# Patient Record
Sex: Male | Born: 1970 | Race: Black or African American | Hispanic: No | Marital: Married | Smoking: Current some day smoker
Health system: Southern US, Community
[De-identification: ages and names within clinical notes are randomized; demographics above are authoritative.]

## PROBLEM LIST (undated history)

## (undated) DIAGNOSIS — E559 Vitamin D deficiency, unspecified: Secondary | ICD-10-CM

## (undated) HISTORY — DX: Vitamin D deficiency, unspecified: E55.9

---

## 2013-02-20 ENCOUNTER — Encounter: Payer: Self-pay | Admitting: Internal Medicine

## 2013-02-20 ENCOUNTER — Ambulatory Visit (INDEPENDENT_AMBULATORY_CARE_PROVIDER_SITE_OTHER): Payer: BC Managed Care – PPO | Admitting: Internal Medicine

## 2013-02-20 VITALS — BP 120/70 | HR 78 | Temp 98.4°F | Resp 18 | Ht 75.0 in | Wt 224.0 lb

## 2013-02-20 DIAGNOSIS — Z0000 Encounter for general adult medical examination without abnormal findings: Secondary | ICD-10-CM

## 2013-02-20 LAB — CBC WITH DIFFERENTIAL/PLATELET
Basophils Absolute: 0.1 10*3/uL (ref 0.0–0.1)
Eosinophils Absolute: 0.2 10*3/uL (ref 0.0–0.7)
Lymphocytes Relative: 26.2 % (ref 12.0–46.0)
MCHC: 34 g/dL (ref 30.0–36.0)
Monocytes Relative: 7.9 % (ref 3.0–12.0)
Neutro Abs: 5.7 10*3/uL (ref 1.4–7.7)
Platelets: 252 10*3/uL (ref 150.0–400.0)
RDW: 14.7 % — ABNORMAL HIGH (ref 11.5–14.6)

## 2013-02-20 LAB — COMPREHENSIVE METABOLIC PANEL
ALT: 21 U/L (ref 0–53)
AST: 17 U/L (ref 0–37)
Albumin: 4.1 g/dL (ref 3.5–5.2)
CO2: 28 mEq/L (ref 19–32)
Calcium: 8.7 mg/dL (ref 8.4–10.5)
Chloride: 105 mEq/L (ref 96–112)
Creatinine, Ser: 1 mg/dL (ref 0.4–1.5)
GFR: 104.42 mL/min (ref >60.00)
Potassium: 3.7 mEq/L (ref 3.5–5.1)
Sodium: 138 mEq/L (ref 135–145)
Total Protein: 6.8 g/dL (ref 6.0–8.3)

## 2013-02-20 LAB — LIPID PANEL
LDL Cholesterol: 70 mg/dL (ref 0–99)
Total CHOL/HDL Ratio: 3
Triglycerides: 111 mg/dL (ref 0.0–149.0)

## 2013-02-20 NOTE — Progress Notes (Signed)
  Subjective:    Patient ID: Brent Calhoun, male    DOB: 07/21/1971, 42 y.o.   MRN: [id number]  HPI  42 year old patient who is seen today to establish with our practice and for a preventative health examination. He enjoys excellent health without concerns or complaints. He is accompanied by his wife. Past medical history is unremarkable. No hospitalizations or surgeries he takes no chronic medications Social history he was born in [place] and has been in the [place] area since 1986. His wife works for [employer] and he presently cares for their children.  He has spent time at [employer] [employer] state and was in the engineering program. He smokes approximately 2 cigars per week Family history father age 94 without any health problems. Mother age 48 with coronary artery disease ischemic heart myopathy and ongoing tobacco use. 2 brothers in good health    Review of Systems  Constitutional: Negative for fever, chills, activity change, appetite change and fatigue.  HENT: Negative for hearing loss, ear pain, congestion, rhinorrhea, sneezing, mouth sores, trouble swallowing, neck pain, neck stiffness, dental problem, voice change, sinus pressure and tinnitus.   Eyes: Negative for photophobia, pain, redness and visual disturbance.  Respiratory: Negative for apnea, cough, choking, chest tightness, shortness of breath and wheezing.   Cardiovascular: Negative for chest pain, palpitations and leg swelling.  Gastrointestinal: Negative for nausea, vomiting, abdominal pain, diarrhea, constipation, blood in stool, abdominal distention, anal bleeding and rectal pain.  Genitourinary: Negative for dysuria, urgency, frequency, hematuria, flank pain, decreased urine volume, discharge, penile swelling, scrotal swelling, difficulty urinating, genital sores and testicular pain.  Musculoskeletal: Negative for myalgias, back pain, joint swelling, arthralgias and gait problem.  Skin: Negative for color change, rash and wound.   Neurological: Negative for dizziness, tremors, seizures, syncope, facial asymmetry, speech difficulty, weakness, light-headedness, numbness and headaches.  Hematological: Negative for adenopathy. Does not bruise/bleed easily.  Psychiatric/Behavioral: Negative for suicidal ideas, hallucinations, behavioral problems, confusion, sleep disturbance, self-injury, dysphoric mood, decreased concentration and agitation. The patient is not nervous/anxious.        Objective:   Physical Exam  Constitutional: He appears well-developed and well-nourished.  HENT:  Head: Normocephalic and atraumatic.  Right Ear: External ear normal.  Left Ear: External ear normal.  Nose: Nose normal.  Mouth/Throat: Oropharynx is clear and moist.  Eyes: Conjunctivae and EOM are normal. Pupils are equal, round, and reactive to light. No scleral icterus.  Neck: Normal range of motion. Neck supple. No JVD present. No thyromegaly present.  Cardiovascular: Regular rhythm, normal heart sounds and intact distal pulses.  Exam reveals no gallop and no friction rub.   No murmur heard. Pulmonary/Chest: Effort normal and breath sounds normal. He exhibits no tenderness.  Abdominal: Soft. Bowel sounds are normal. He exhibits no distension and no mass. There is no tenderness.  Genitourinary: Penis normal.  Musculoskeletal: Normal range of motion. He exhibits no edema and no tenderness.  Lymphadenopathy:    He has no cervical adenopathy.  Neurological: He is alert. He has normal reflexes. No cranial nerve deficit. Coordination normal.  Skin: Skin is warm and dry. No rash noted.  Psychiatric: He has a normal mood and affect. His behavior is normal.          Assessment & Plan:   Preventive health examination. Unremarkable exam. We'll check some updated lab. Modest weight loss exercise regimen encouraged

## 2013-02-20 NOTE — Patient Instructions (Signed)
It is important that you exercise regularly, at least 20 minutes 3 to 4 times per week.  If you develop chest pain or shortness of breath seek  medical attention.  You need to lose weight.  Consider a lower calorie diet and regular exercise.  Smoking tobacco is very bad for your health. You should stop smoking immediately. 

## 2013-02-26 ENCOUNTER — Telehealth: Payer: Self-pay | Admitting: Internal Medicine

## 2013-02-26 ENCOUNTER — Telehealth: Payer: Self-pay | Admitting: *Deleted

## 2013-02-26 NOTE — Telephone Encounter (Signed)
Left message on voicemail to call office. See other message in results.

## 2013-02-26 NOTE — Telephone Encounter (Signed)
Pt would like lab results from 4/4. He states that he is anxious. Please assist.

## 2013-02-26 NOTE — Telephone Encounter (Signed)
Pt called back told him results were normal and that his wife ( not daughter) called for result for him early due to [place] playing phone tag. Pt verbalized understanding and said he just received text from wife that she got results. Told him okay.

## 2013-02-26 NOTE — Telephone Encounter (Signed)
See other message

## 2014-01-18 ENCOUNTER — Ambulatory Visit (INDEPENDENT_AMBULATORY_CARE_PROVIDER_SITE_OTHER): Payer: BC Managed Care – PPO | Admitting: Family Medicine

## 2014-01-18 ENCOUNTER — Encounter: Payer: Self-pay | Admitting: Family Medicine

## 2014-01-18 VITALS — BP 112/70 | HR 93 | Temp 98.1°F | Ht 75.0 in | Wt 222.0 lb

## 2014-01-18 DIAGNOSIS — N390 Urinary tract infection, site not specified: Secondary | ICD-10-CM

## 2014-01-18 MED ORDER — CIPROFLOXACIN HCL 500 MG PO TABS: 500.0000 mg | ORAL_TABLET | Freq: Two times a day (BID) | ORAL | Status: DC

## 2014-01-18 NOTE — Progress Notes (Signed)
Pre visit review using our clinic review tool, if applicable. No additional management support is needed unless otherwise documented below in the visit note. 

## 2014-01-18 NOTE — Progress Notes (Signed)
   Subjective:    Patient ID: Brent Calhoun, male    DOB: 1971/02/16, 43 y.o.   MRN: [id number]  HPI Here for one month of foul smelling urine and 2 weeks of a dry cough. He does not feel ill, no fever or ST. No urinary burning or urgency. No urethral DC.    Review of Systems  Constitutional: Negative.   HENT: Negative.   Eyes: Negative.   Respiratory: Positive for cough. Negative for shortness of breath and wheezing.   Cardiovascular: Negative.   Genitourinary: Negative.        Objective:   Physical Exam  Constitutional: He appears well-developed and well-nourished.  HENT:  Right Ear: External ear normal.  Left Ear: External ear normal.  Nose: Nose normal.  Mouth/Throat: Oropharynx is clear and moist.  Eyes: Conjunctivae are normal.  Pulmonary/Chest: Effort normal and breath sounds normal.  Abdominal: Soft. Bowel sounds are normal. He exhibits no distension and no mass. There is no tenderness. There is no rebound and no guarding.  Lymphadenopathy:    He has no cervical adenopathy.          Assessment & Plan:  He was unable to produce a urine sample, so we will treat with Cipro. He needs to drink less sodas and more water.

## 2014-04-23 ENCOUNTER — Encounter: Payer: Self-pay | Admitting: Internal Medicine

## 2014-04-23 ENCOUNTER — Ambulatory Visit (INDEPENDENT_AMBULATORY_CARE_PROVIDER_SITE_OTHER): Payer: BC Managed Care – PPO | Admitting: Internal Medicine

## 2014-04-23 VITALS — BP 128/80 | HR 96 | Temp 98.7°F | Resp 20 | Ht 75.0 in | Wt 230.0 lb

## 2014-04-23 DIAGNOSIS — M109 Gout, unspecified: Secondary | ICD-10-CM

## 2014-04-23 LAB — URIC ACID: Uric Acid, Serum: 8.7 mg/dL — ABNORMAL HIGH (ref 4.0–7.8)

## 2014-04-23 MED ORDER — PREDNISONE 20 MG PO TABS: 20.0000 mg | ORAL_TABLET | Freq: Two times a day (BID) | ORAL | Status: DC

## 2014-04-23 MED ORDER — HYDROCODONE-ACETAMINOPHEN 10-325 MG PO TABS: 1.0000 | ORAL_TABLET | Freq: Three times a day (TID) | ORAL | Status: DC | PRN

## 2014-04-23 NOTE — Patient Instructions (Signed)
Keep right hand elevated as much as possible  You  may move around, but avoid painful motions and activities.  Apply ice to the sore area for 15 to 20 minutes 3 or 4 times daily for the next two to 3 days.Gout Gout is an inflammatory arthritis caused by a buildup of uric acid crystals in the joints. Uric acid is a chemical that is normally present in the blood. When the level of uric acid in the blood is too high it can form crystals that deposit in your joints and tissues. This causes joint redness, soreness, and swelling (inflammation). Repeat attacks are common. Over time, uric acid crystals can form into masses (tophi) near a joint, destroying bone and causing disfigurement. Gout is treatable and often preventable. CAUSES  The disease begins with elevated levels of uric acid in the blood. Uric acid is produced by your body when it breaks down a naturally found substance called purines. Certain foods you eat, such as meats and fish, contain high amounts of purines. Causes of an elevated uric acid level include:  Being passed down from parent to child (heredity).  Diseases that cause increased uric acid production (such as obesity, psoriasis, and certain cancers).  Excessive alcohol use.  Diet, especially diets rich in meat and seafood.  Medicines, including certain cancer-fighting medicines (chemotherapy), water pills (diuretics), and aspirin.  Chronic kidney disease. The kidneys are no longer able to remove uric acid well.  Problems with metabolism. Conditions strongly associated with gout include:  Obesity.  High blood pressure.  High cholesterol.  Diabetes. Not everyone with elevated uric acid levels gets gout. It is not understood why some people get gout and others do not. Surgery, joint injury, and eating too much of certain foods are some of the factors that can lead to gout attacks. SYMPTOMS   An attack of gout comes on quickly. It causes intense pain with redness,  swelling, and warmth in a joint.  Fever can occur.  Often, only one joint is involved. Certain joints are more commonly involved:  Base of the big toe.  Knee.  Ankle.  Wrist.  Finger. Without treatment, an attack usually goes away in a few days to weeks. Between attacks, you usually will not have symptoms, which is different from many other forms of arthritis. DIAGNOSIS  Your caregiver will suspect gout based on your symptoms and exam. In some cases, tests may be recommended. The tests may include:  Blood tests.  Urine tests.  X-rays.  Joint fluid exam. This exam requires a needle to remove fluid from the joint (arthrocentesis). Using a microscope, gout is confirmed when uric acid crystals are seen in the joint fluid. TREATMENT  There are two phases to gout treatment: treating the sudden onset (acute) attack and preventing attacks (prophylaxis).  Treatment of an Acute Attack.  Medicines are used. These include anti-inflammatory medicines or steroid medicines.  An injection of steroid medicine into the affected joint is sometimes necessary.  The painful joint is rested. Movement can worsen the arthritis.  You may use warm or cold treatments on painful joints, depending which works best for you.  Treatment to Prevent Attacks.  If you suffer from frequent gout attacks, your caregiver may advise preventive medicine. These medicines are started after the acute attack subsides. These medicines either help your kidneys eliminate uric acid from your body or decrease your uric acid production. You may need to stay on these medicines for a very long time.  The early phase  of treatment with preventive medicine can be associated with an increase in acute gout attacks. For this reason, during the first few months of treatment, your caregiver may also advise you to take medicines usually used for acute gout treatment. Be sure you understand your caregiver's directions. Your caregiver may  make several adjustments to your medicine dose before these medicines are effective.  Discuss dietary treatment with your caregiver or dietitian. Alcohol and drinks high in sugar and fructose and foods such as meat, poultry, and seafood can increase uric acid levels. Your caregiver or dietician can advise you on drinks and foods that should be limited. HOME CARE INSTRUCTIONS   Do not take aspirin to relieve pain. This raises uric acid levels.  Only take over-the-counter or prescription medicines for pain, discomfort, or fever as directed by your caregiver.  Rest the joint as much as possible. When in bed, keep sheets and blankets off painful areas.  Keep the affected joint raised (elevated).  Apply warm or cold treatments to painful joints. Use of warm or cold treatments depends on which works best for you.  Use crutches if the painful joint is in your leg.  Drink enough fluids to keep your urine clear or pale yellow. This helps your body get rid of uric acid. Limit alcohol, sugary drinks, and fructose drinks.  Follow your dietary instructions. Pay careful attention to the amount of protein you eat. Your daily diet should emphasize fruits, vegetables, whole grains, and fat-free or low-fat milk products. Discuss the use of coffee, vitamin C, and cherries with your caregiver or dietician. These may be helpful in lowering uric acid levels.  Maintain a healthy body weight. SEEK MEDICAL CARE IF:   You develop diarrhea, vomiting, or any side effects from medicines.  You do not feel better in 24 hours, or you are getting worse. SEEK IMMEDIATE MEDICAL CARE IF:   Your joint becomes suddenly more tender, and you have chills or a fever. MAKE SURE YOU:   Understand these instructions.  Will watch your condition.  Will get help right away if you are not doing well or get worse. Document Released: 11/02/2000 Document Revised: 03/02/2013 Document Reviewed: 06/18/2012 [facility] Patient  Information 2014 [place], [place].

## 2014-04-23 NOTE — Progress Notes (Signed)
   Subjective:    Patient ID: Brent Calhoun, male    DOB: 1971/11/14, 43 y.o.   MRN: [id number]  HPI  43 year old patient who enjoys excellent health.  Earlier in the week.  He had some mild pain and stiffness involving his left wrist.  This resolved after 3 days, but over the past 2 days has had worsening pain and swelling involving his right wrist.  No early morning stiffness.  He states he was treated her for gout involving his feet in the past.  He has been applying I.'s that taking no medications.  History reviewed. No pertinent past medical history.  History   Social History  . Marital Status: Married    Spouse Name: N/A    Number of Children: N/A  . Years of Education: N/A   Occupational History  . Not on file.   Social History Main Topics  . Smoking status: Former Smoker    Types: Cigars  . Smokeless tobacco: Never Used  . Alcohol Use: 3.6 oz/week    6 Cans of beer per week  . Drug Use: No  . Sexual Activity: Not on file   Other Topics Concern  . Not on file   Social History Narrative  . No narrative on file    History reviewed. No pertinent past surgical history.  No family history on file.  No Known Allergies  Current Outpatient Prescriptions on File Prior to Visit  Medication Sig Dispense Refill  . ibuprofen (ADVIL,MOTRIN) 200 MG tablet Take 400 mg by mouth every 6 (six) hours as needed for pain.       No current facility-administered medications on file prior to visit.    BP 128/80  Pulse 96  Temp(Src) 98.7 F (37.1 C) (Oral)  Resp 20  Ht 6\' 3"  (1.905 m)  Wt 230 lb (104.327 kg)  BMI 28.75 kg/m2  SpO2 98%       Review of Systems  Musculoskeletal: Positive for arthralgias and joint swelling.       Objective:   Physical Exam  Constitutional: He appears well-developed and well-nourished. No distress.  Musculoskeletal:  Left wrist appears normal Right wrist is swollen, warm to touch.  Soft tissue swelling extends to involve the dorsal  aspect of the proximal right hand slightly          Assessment & Plan:   Probable gout right wrist.  We'll check a uric acid level we'll treat with prednisone for 7 days, as well as analgesics.  We'll treat with elevation and ice.  Will call if unimproved or any clinical worsening

## 2014-04-23 NOTE — Progress Notes (Signed)
Pre-visit discussion using our clinic review tool. No additional management support is needed unless otherwise documented below in the visit note.  

## 2014-04-27 ENCOUNTER — Telehealth: Payer: Self-pay | Admitting: Internal Medicine

## 2014-04-27 NOTE — Telephone Encounter (Signed)
Pt notified Uric Acid elevated at 8.7 and to finish all prednisone that was prescribed. Pt verbalized understanding.

## 2014-04-27 NOTE — Telephone Encounter (Signed)
Pt needs blood work results °

## 2014-12-07 ENCOUNTER — Other Ambulatory Visit: Payer: Self-pay | Admitting: Internal Medicine

## 2014-12-07 MED ORDER — HYDROCODONE-ACETAMINOPHEN 10-325 MG PO TABS: 1.0000 | ORAL_TABLET | Freq: Three times a day (TID) | ORAL | Status: DC | PRN

## 2014-12-07 NOTE — Telephone Encounter (Signed)
Please see message and advise 

## 2014-12-07 NOTE — Telephone Encounter (Signed)
rx up front for p/u, pt aware 

## 2014-12-07 NOTE — Telephone Encounter (Signed)
Pt seen 04/23/14 and states he had gout.  Pt states he is having that same pain again in his hand and foot,  would like a refill of HYDROcodone-acetaminophen (NORCO) 10-325 MG per tablet for the pain. Advised pt he may need appt. Pt states he prefers not to if he doesn't have.

## 2014-12-07 NOTE — Telephone Encounter (Signed)
Ok  #20  Suggest also aleve 2 tabs every 8 hours

## 2014-12-12 ENCOUNTER — Encounter (HOSPITAL_COMMUNITY): Payer: Self-pay | Admitting: Emergency Medicine

## 2014-12-12 ENCOUNTER — Emergency Department (HOSPITAL_COMMUNITY)
Admission: EM | Admit: 2014-12-12 | Discharge: 2014-12-12 | Disposition: A | Discharge status: Home / Self Care | Payer: 59 | Attending: Emergency Medicine | Admitting: Emergency Medicine

## 2014-12-12 ENCOUNTER — Emergency Department (HOSPITAL_COMMUNITY): Payer: 59

## 2014-12-12 DIAGNOSIS — J3489 Other specified disorders of nose and nasal sinuses: Secondary | ICD-10-CM | POA: Diagnosis not present

## 2014-12-12 DIAGNOSIS — R0789 Other chest pain: Secondary | ICD-10-CM | POA: Diagnosis not present

## 2014-12-12 DIAGNOSIS — R05 Cough: Secondary | ICD-10-CM

## 2014-12-12 DIAGNOSIS — R079 Chest pain, unspecified: Secondary | ICD-10-CM | POA: Diagnosis present

## 2014-12-12 DIAGNOSIS — Z87891 Personal history of nicotine dependence: Secondary | ICD-10-CM | POA: Diagnosis not present

## 2014-12-12 DIAGNOSIS — R0981 Nasal congestion: Secondary | ICD-10-CM | POA: Diagnosis not present

## 2014-12-12 DIAGNOSIS — R059 Cough, unspecified: Secondary | ICD-10-CM

## 2014-12-12 LAB — CBC WITH DIFFERENTIAL/PLATELET
BASOS ABS: 0.1 10*3/uL (ref 0.0–0.1)
Basophils Relative: 1 % (ref 0–1)
EOS ABS: 0.2 10*3/uL (ref 0.0–0.7)
EOS PCT: 2 % (ref 0–5)
HCT: 43.6 % (ref 39.0–52.0)
HEMOGLOBIN: 14.9 g/dL (ref 13.0–17.0)
Lymphocytes Relative: 24 % (ref 12–46)
Lymphs Abs: 2.2 10*3/uL (ref 0.7–4.0)
MCH: 30.9 pg (ref 26.0–34.0)
MCHC: 34.2 g/dL (ref 30.0–36.0)
MCV: 90.5 fL (ref 78.0–100.0)
MONO ABS: 1.3 10*3/uL — AB (ref 0.1–1.0)
MONOS PCT: 14 % — AB (ref 3–12)
Neutro Abs: 5.6 10*3/uL (ref 1.7–7.7)
Neutrophils Relative %: 59 % (ref 43–77)
Platelets: 263 10*3/uL (ref 150–400)
RBC: 4.82 MIL/uL (ref 4.22–5.81)
RDW: 14 % (ref 11.5–15.5)
WBC: 9.4 10*3/uL (ref 4.0–10.5)

## 2014-12-12 LAB — COMPREHENSIVE METABOLIC PANEL
ALT: 24 U/L (ref 0–53)
AST: 19 U/L (ref 0–37)
Albumin: 4 g/dL (ref 3.5–5.2)
Alkaline Phosphatase: 99 U/L (ref 39–117)
Anion gap: 4 — ABNORMAL LOW (ref 5–15)
BUN: 11 mg/dL (ref 6–23)
CO2: 31 mmol/L (ref 19–32)
CREATININE: 0.9 mg/dL (ref 0.50–1.35)
Calcium: 9.5 mg/dL (ref 8.4–10.5)
Chloride: 103 mmol/L (ref 96–112)
GFR calc non Af Amer: >90 mL/min (ref >90)
GLUCOSE: 96 mg/dL (ref 70–99)
Potassium: 3.9 mmol/L (ref 3.5–5.1)
Sodium: 138 mmol/L (ref 135–145)
Total Bilirubin: 0.7 mg/dL (ref 0.3–1.2)
Total Protein: 6.9 g/dL (ref 6.0–8.3)

## 2014-12-12 LAB — I-STAT TROPONIN, ED: TROPONIN I, POC: 0 ng/mL (ref 0.00–0.08)

## 2014-12-12 MED ORDER — TRAMADOL HCL 50 MG PO TABS
50.0000 mg | ORAL_TABLET | Freq: Once | ORAL | Status: AC
  Administered 2014-12-12: 50 mg via ORAL
  Filled 2014-12-12: qty 1

## 2014-12-12 MED ORDER — BENZONATATE 100 MG PO CAPS: 100.0000 mg | ORAL_CAPSULE | Freq: Three times a day (TID) | ORAL | Status: DC

## 2014-12-12 MED ORDER — TRAMADOL HCL 50 MG PO TABS: 50.0000 mg | ORAL_TABLET | Freq: Four times a day (QID) | ORAL | Status: DC | PRN

## 2014-12-12 NOTE — Discharge Instructions (Signed)

## 2014-12-12 NOTE — ED Notes (Signed)
Pt. Stated, I've had chest pain for 2 weeks constant. I've also had a cold and cough.

## 2014-12-12 NOTE — ED Provider Notes (Signed)
CSN: [id number]     Arrival date & time 12/12/14  1037 History   First MD Initiated Contact with Patient 12/12/14 1053     Chief Complaint  Patient presents with  . Chest Pain   HPI  Patient is a 44 year old male with no past medical history who presents emergency room for evaluation of right-sided chest pain. Patient states that 3 weeks ago he developed some chest pain after bowling. He is also having a dry cough, runny nose and congestion at that time. Patient states that the coughing that his chest pain worse. He has an aching pain when he lifts his arm or when he moves. He denies any shortness of breath, fevers, chills, nausea, vomiting, dizziness, lightheadedness, numbness, melena, hematochezia, diarrhea, constipation. Patient does not have a history of chest pain. He has no cardiac comes that he is aware of. His mother had a heart attack at age 46, but patient also states that she was a very heavy smoker. He has no other cardiac history in his family. Patient has tried no relieving factors at this time.  History reviewed. No pertinent past medical history. History reviewed. No pertinent past surgical history. No family history on file. History  Substance Use Topics  . Smoking status: Former Smoker    Types: Cigars  . Smokeless tobacco: Never Used  . Alcohol Use: 3.6 oz/week    6 Cans of beer per week    Review of Systems  Constitutional: Negative for chills and fatigue.  HENT: Positive for congestion and rhinorrhea. Negative for postnasal drip and sinus pressure.   Respiratory: Positive for cough. Negative for chest tightness, shortness of breath and wheezing.   Cardiovascular: Negative for chest pain, palpitations and leg swelling.  Gastrointestinal: Negative for nausea, vomiting, abdominal pain, diarrhea and constipation.  Neurological: Negative for dizziness, syncope, weakness and numbness.  All other systems reviewed and are negative.     Allergies  Review of patient's  allergies indicates no known allergies.  Home Medications   Prior to Admission medications   Medication Sig Start Date End Date Taking? Authorizing Provider  HYDROcodone-acetaminophen (NORCO) 10-325 MG per tablet Take 1 tablet by mouth every 8 (eight) hours as needed. 12/07/14  Yes Gordy SaversPeter F Kwiatkowski, MD  ibuprofen (ADVIL,MOTRIN) 200 MG tablet Take 400 mg by mouth every 6 (six) hours as needed for pain.   Yes Historical Provider, MD  benzonatate (TESSALON) 100 MG capsule Take 1 capsule (100 mg total) by mouth every 8 (eight) hours. 12/12/14   Jen Benedict A Forcucci, PA-C  predniSONE (DELTASONE) 20 MG tablet Take 1 tablet (20 mg total) by mouth 2 (two) times daily with a meal. Patient not taking: Reported on 12/12/2014 04/23/14   Gordy SaversPeter F Kwiatkowski, MD  traMADol (ULTRAM) 50 MG tablet Take 1 tablet (50 mg total) by mouth every 6 (six) hours as needed. 12/12/14   Tristy Udovich A Forcucci, PA-C   BP 106/75 mmHg  Pulse 69  Temp(Src) 98.4 F (36.9 C) (Oral)  Resp 19  Ht 6\' 4"  (1.93 m)  Wt 225 lb (102.059 kg)  BMI 27.40 kg/m2  SpO2 99% Physical Exam  Constitutional: He is oriented to person, place, and time. He appears well-developed and well-nourished. No distress.  HENT:  Head: Normocephalic and atraumatic.  Mouth/Throat: Oropharynx is clear and moist. No oropharyngeal exudate.  Eyes: Conjunctivae and EOM are normal. Pupils are equal, round, and reactive to light. No scleral icterus.  Neck: Normal range of motion. Neck supple. No JVD present. No thyromegaly  present.  Cardiovascular: Normal rate, regular rhythm, normal heart sounds and intact distal pulses.  Exam reveals no gallop and no friction rub.   No murmur heard. Pulmonary/Chest: Effort normal and breath sounds normal. No respiratory distress. He has no wheezes. He has no rales. He exhibits tenderness.  Right-sided chest wall tenderness that reproduces chest pain patient has been experiencing  Abdominal: Soft. Bowel sounds are normal. He  exhibits no distension and no mass. There is no tenderness. There is no rebound and no guarding.  Musculoskeletal: Normal range of motion.  Lymphadenopathy:    He has no cervical adenopathy.  Neurological: He is alert and oriented to person, place, and time. He has normal strength. No cranial nerve deficit or sensory deficit. Coordination normal.  Skin: Skin is warm and dry. He is not diaphoretic.  Psychiatric: He has a normal mood and affect. His behavior is normal. Judgment and thought content normal.  Nursing note and vitals reviewed.   ED Course  Procedures (including critical care time) Labs Review Labs Reviewed  CBC WITH DIFFERENTIAL/PLATELET - Abnormal; Notable for the following:    Monocytes Relative 14 (*)    Monocytes Absolute 1.3 (*)    All other components within normal limits  COMPREHENSIVE METABOLIC PANEL - Abnormal; Notable for the following:    Anion gap 4 (*)    All other components within normal limits  I-STAT TROPOININ, ED    Imaging Review Dg Chest 2 View  12/12/2014   CLINICAL DATA:  Cough with sharp right-sided pleuritic chest pain.  EXAM: CHEST - 2 VIEW  COMPARISON:  None  FINDINGS: The heart size and mediastinal contours are within normal limits. There is no evidence of pulmonary edema, consolidation, pneumothorax, nodule or pleural fluid. The visualized skeletal structures are unremarkable.  IMPRESSION: No active disease.   Electronically Signed   By: Irish Lack M.D.   On: 12/12/2014 11:42     EKG Interpretation   Date/Time:  Sunday December 12 2014 10:39:03 EST Ventricular Rate:  86 PR Interval:  164 QRS Duration: 82 QT Interval:  352 QTC Calculation: 421 R Axis:   85 Text Interpretation:  Normal sinus rhythm Nonspecific T wave abnormality  Abnormal ECG No previous ECGs available Confirmed by YAO  MD, DAVID  ([id number]) on 12/12/2014 10:56:36 AM      MDM   Final diagnoses:  Cough  Chest wall pain   Patient 44 year old male who presents  emergency room for evaluation of right-sided chest pain which is been going on for approximately 3 weeks. Patient is also having cold symptoms. Patient has reproducible chest pain on the right side of the chest wall to palpation. Chest x-ray is negative. EKG reveals nonspecific T-wave changes with no previous EKGs to compare to. CBC, CMP, and i-STAT troponin reveal no acute abnormalities. Patient has a heart score of 2. Patient is perk negative. Given history of 3 weeks of pain and atypical nature suspect that this is likely chest wall pain. Have very low suspicion for any cardiac causes. We'll discharge home with Ultram as needed for pain area will send home with Tessalon Perles for coughing. We'll have patient follow-up with his PCP. Patient return for worsening chest pain, shortness of breath, or any other concerning symptoms. He states understanding and agreement at this time. Patient stable for discharge.    Eben Burow, PA-C 12/12/14 1252  Richardean Canal, MD 12/12/14 [phone]

## 2016-06-14 IMAGING — DX DG CHEST 2V
2 series · 2 of 2 positions shown · non-contrast
Comparison: None

CLINICAL DATA: Cough with sharp right-sided pleuritic chest pain.

EXAM:
CHEST - 2 VIEW

[chest pa]
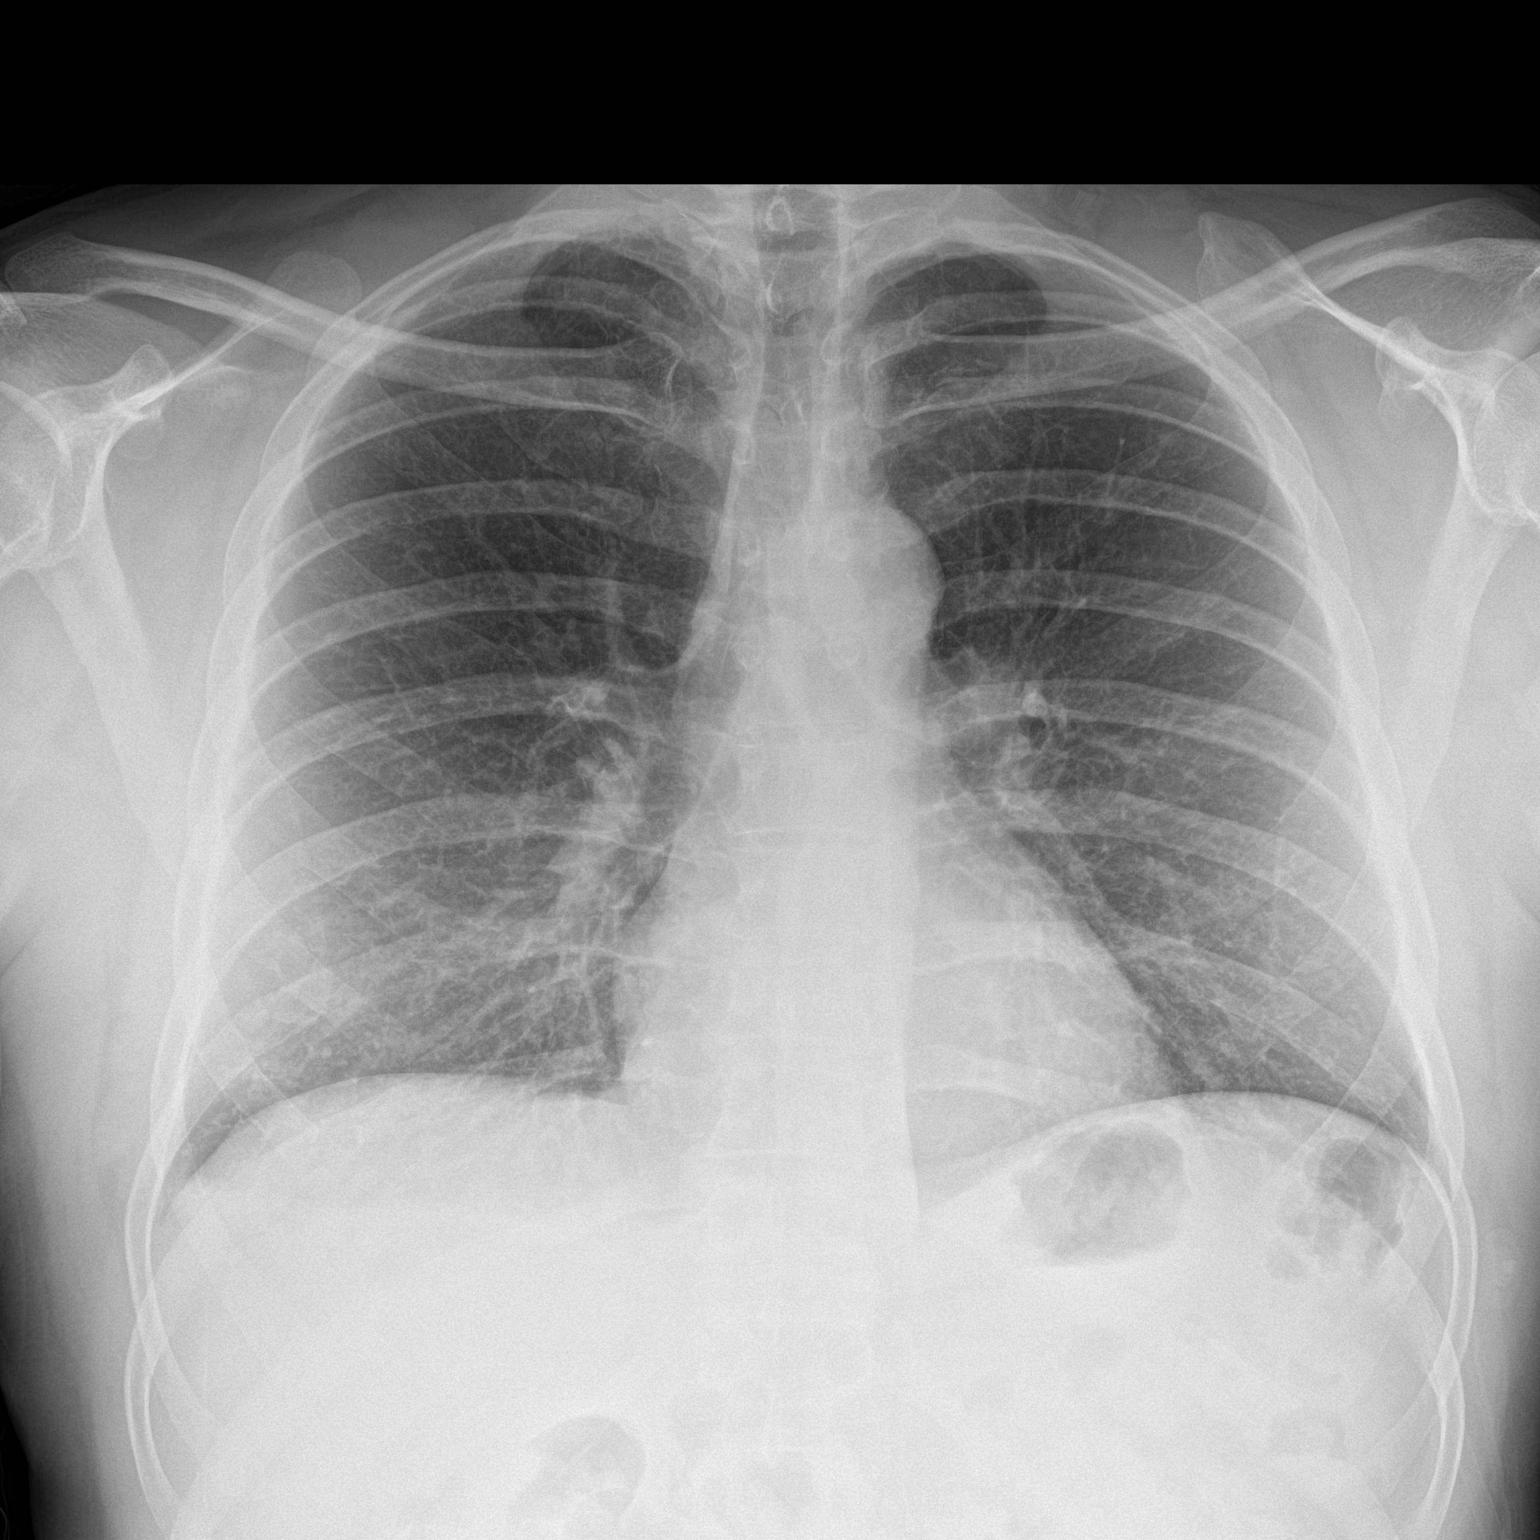

[chest lat]
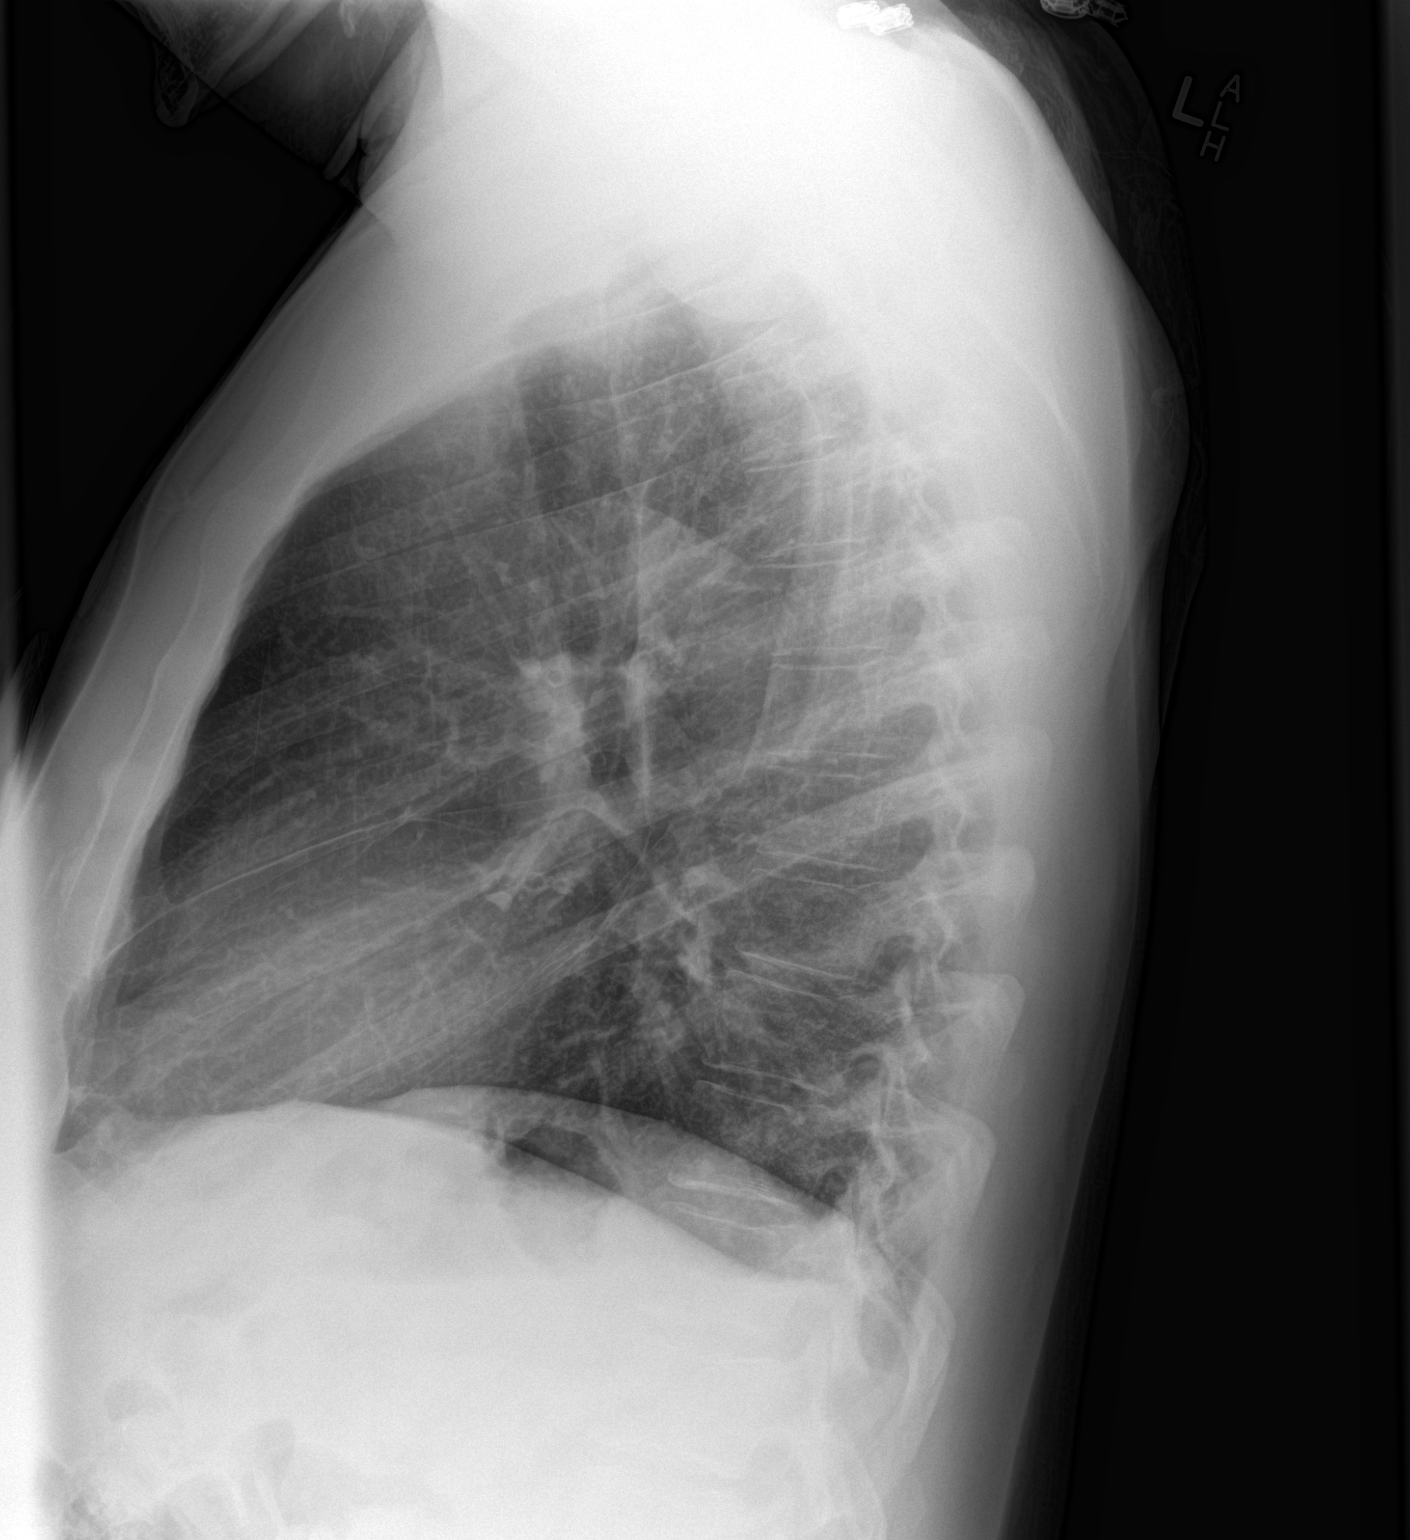

[2 of 2 positions shown; findings below may reference images not displayed]

FINDINGS: The heart size and mediastinal contours are within normal limits.
There is no evidence of pulmonary edema, consolidation,
pneumothorax, nodule or pleural fluid. The visualized skeletal
structures are unremarkable.
IMPRESSION: No active disease.

## 2018-04-25 ENCOUNTER — Encounter: Payer: Self-pay | Admitting: Family Medicine

## 2018-04-25 ENCOUNTER — Ambulatory Visit (INDEPENDENT_AMBULATORY_CARE_PROVIDER_SITE_OTHER): Payer: 59 | Admitting: Family Medicine

## 2018-04-25 VITALS — BP 100/70 | HR 98 | Temp 98.4°F | Ht 76.0 in | Wt 213.9 lb

## 2018-04-25 DIAGNOSIS — Z0000 Encounter for general adult medical examination without abnormal findings: Secondary | ICD-10-CM | POA: Diagnosis not present

## 2018-04-25 DIAGNOSIS — Z131 Encounter for screening for diabetes mellitus: Secondary | ICD-10-CM

## 2018-04-25 DIAGNOSIS — Z13220 Encounter for screening for lipoid disorders: Secondary | ICD-10-CM | POA: Diagnosis not present

## 2018-04-25 DIAGNOSIS — L84 Corns and callosities: Secondary | ICD-10-CM | POA: Diagnosis not present

## 2018-04-25 NOTE — Patient Instructions (Addendum)
Remember that the lab is open each week day from 7:30 AM to 5:30 PM.  Preventive Care 40-64 Years, Male Preventive care refers to lifestyle choices and visits with your health care provider that can promote health and wellness. What does preventive care include?  A yearly physical exam. This is also called an annual well check.  Dental exams once or twice a year.  Routine eye exams. Ask your health care provider how often you should have your eyes checked.  Personal lifestyle choices, including: ? Daily care of your teeth and gums. ? Regular physical activity. ? Eating a healthy diet. ? Avoiding tobacco and drug use. ? Limiting alcohol use. ? Practicing safe sex. ? Taking low-dose aspirin every day starting at age 28. What happens during an annual well check? The services and screenings done by your health care provider during your annual well check will depend on your age, overall health, lifestyle risk factors, and family history of disease. Counseling Your health care provider may ask you questions about your:  Alcohol use.  Tobacco use.  Drug use.  Emotional well-being.  Home and relationship well-being.  Sexual activity.  Eating habits.  Work and work Statistician.  Screening You may have the following tests or measurements:  Height, weight, and BMI.  Blood pressure.  Lipid and cholesterol levels. These may be checked every 5 years, or more frequently if you are over 41 years old.  Skin check.  Lung cancer screening. You may have this screening every year starting at age 78 if you have a 30-pack-year history of smoking and currently smoke or have quit within the past 15 years.  Fecal occult blood test (FOBT) of the stool. You may have this test every year starting at age 72.  Flexible sigmoidoscopy or colonoscopy. You may have a sigmoidoscopy every 5 years or a colonoscopy every 10 years starting at age 107.  Prostate cancer screening. Recommendations will  vary depending on your family history and other risks.  Hepatitis C blood test.  Hepatitis B blood test.  Sexually transmitted disease (STD) testing.  Diabetes screening. This is done by checking your blood sugar (glucose) after you have not eaten for a while (fasting). You may have this done every 1-3 years.  Discuss your test results, treatment options, and if necessary, the need for more tests with your health care provider. Vaccines Your health care provider may recommend certain vaccines, such as:  Influenza vaccine. This is recommended every year.  Tetanus, diphtheria, and acellular pertussis (Tdap, Td) vaccine. You may need a Td booster every 10 years.  Varicella vaccine. You may need this if you have not been vaccinated.  Zoster vaccine. You may need this after age 74.  Measles, mumps, and rubella (MMR) vaccine. You may need at least one dose of MMR if you were born in 1957 or later. You may also need a second dose.  Pneumococcal 13-valent conjugate (PCV13) vaccine. You may need this if you have certain conditions and have not been vaccinated.  Pneumococcal polysaccharide (PPSV23) vaccine. You may need one or two doses if you smoke cigarettes or if you have certain conditions.  Meningococcal vaccine. You may need this if you have certain conditions.  Hepatitis A vaccine. You may need this if you have certain conditions or if you travel or work in places where you may be exposed to hepatitis A.  Hepatitis B vaccine. You may need this if you have certain conditions or if you travel or work in  places where you may be exposed to hepatitis B.  Haemophilus influenzae type b (Hib) vaccine. You may need this if you have certain risk factors.  Talk to your health care provider about which screenings and vaccines you need and how often you need them. This information is not intended to replace advice given to you by your health care provider. Make sure you discuss any questions you  have with your health care provider. Document Released: 12/02/2015 Document Revised: 07/25/2016 Document Reviewed: 09/06/2015 Elsevier Interactive Patient Education  2018 [place] are small areas of thickened skin that occur on the top, sides, or tip of a toe. They contain a cone-shaped core with a point that can press on a nerve below. This causes pain. Calluses are areas of thickened skin that can occur anywhere on the body including hands, fingers, palms, soles of the feet, and heels.Calluses are usually larger than corns. What are the causes? Corns and calluses are caused by rubbing (friction) or pressure, such as from shoes that are too tight or do not fit properly. What increases the risk? Corns are more likely to develop in people who have toe deformities, such as hammer toes. Since calluses can occur with friction to any area of the skin, calluses are more likely to develop in people who:  Work with their hands.  Wear shoes that fit poorly, shoes that are too tight, or shoes that are high-heeled.  Have toes deformities.  What are the signs or symptoms? Symptoms of a corn or callus include:  A hard growth on the skin.  Pain or tenderness under the skin.  Redness and swelling.  Increased discomfort while wearing tight-fitting shoes.  How is this diagnosed? Corns and calluses may be diagnosed with a medical history and physical exam. How is this treated? Corns and calluses may be treated with:  Removing the cause of the friction or pressure. This may include: ? Changing your shoes. ? Wearing shoe inserts (orthotics) or other protective layers in your shoes, such as a corn pad. ? Wearing gloves.  Medicines to help soften skin in the hardened, thickened areas.  Reducing the size of the corn or callus by removing the dead layers of skin.  Antibiotic medicines to treat infection.  Surgery, if a toe deformity is the cause.  Follow these  instructions at home:  Take medicines only as directed by your health care provider.  If you were prescribed an antibiotic, finish all of it even if you start to feel better.  Wear shoes that fit well. Avoid wearing high-heeled shoes and shoes that are too tight or too loose.  Wear any padding, protective layers, gloves, or orthotics as directed by your health care provider.  Soak your hands or feet and then use a file or pumice stone to soften your corn or callus. Do this as directed by your health care provider.  Check your corn or callus every day for signs of infection. Watch for: ? Redness, swelling, or pain. ? Fluid, blood, or pus. Contact a health care provider if:  Your symptoms do not improve with treatment.  You have increased redness, swelling, or pain at the site of your corn or callus.  You have fluid, blood, or pus coming from your corn or callus.  You have new symptoms. This information is not intended to replace advice given to you by your health care provider. Make sure you discuss any questions you have with your health care  provider. Document Released: 08/11/2004 Document Revised: 05/25/2016 Document Reviewed: 11/01/2014 Elsevier Interactive Patient Education  [employer].

## 2018-04-25 NOTE — Progress Notes (Signed)
Subjective:     Brent Calhoun is a 47 y.o. male and is here for a comprehensive physical exam. Pt was formerly seen by Dr. Kirtland BouchardK several yrs ago.  The pt reports no problems. Pt not taking any medications.  Drinking ~2 bottles of water per day, but noted he needs to increase this.  Patient does mention occasionally getting a callus on his right foot that has a hard center.  Patient states that he removes the hard center he does not have any pain.  Allergies: NKDA  Past surgical history: None  Social history: Patient is married.  He has 3 children.  The youngest of which is 49 years old.  Pt went to college for 4 years.  Pt has making T-shirts. Pt endorses smoking cigars on the weekends as well as social EtOH use on the wknds.  Pt denies drug use.  Social History   Socioeconomic History  . Marital status: Married    Spouse name: Not on file  . Number of children: Not on file  . Years of education: Not on file  . Highest education level: Not on file  Occupational History  . Not on file  Social Needs  . Financial resource strain: Not on file  . Food insecurity:    Worry: Not on file    Inability: Not on file  . Transportation needs:    Medical: Not on file    Non-medical: Not on file  Tobacco Use  . Smoking status: Current Some Day Smoker    Types: Cigars  . Smokeless tobacco: Never Used  Substance and Sexual Activity  . Alcohol use: Yes    Alcohol/week: 3.6 oz    Types: 6 Cans of beer per week  . Drug use: No  . Sexual activity: Not on file  Lifestyle  . Physical activity:    Days per week: Not on file    Minutes per session: Not on file  . Stress: Not on file  Relationships  . Social connections:    Talks on phone: Not on file    Gets together: Not on file    Attends religious service: Not on file    Active member of club or organization: Not on file    Attends meetings of clubs or organizations: Not on file    Relationship status: Not on file  . Intimate partner  violence:    Fear of current or ex partner: Not on file    Emotionally abused: Not on file    Physically abused: Not on file    Forced sexual activity: Not on file  Other Topics Concern  . Not on file  Social History Narrative  . Not on file   Health Maintenance  Topic Date Due  . TETANUS/TDAP  03/13/1988  . HIV Screening  04/26/2019 (Originally 03/13/1984)  . INFLUENZA VACCINE  06/19/2018    The following portions of the patient's history were reviewed and updated as appropriate: allergies, current medications, past family history, past medical history, past social history, past surgical history and problem list.  Review of Systems A comprehensive review of systems was negative.   Objective:    BP 100/70 (BP Location: Left Arm, Patient Position: Sitting, Cuff Size: Normal)   Pulse 98   Temp 98.4 F (36.9 C) (Oral)   Ht 6\' 4"  (1.93 m)   Wt 213 lb 14.4 oz (97 kg)   SpO2 98%   BMI 26.04 kg/m  General appearance: alert, cooperative, appears stated age and  no distress Head: Normocephalic, without obvious abnormality, atraumatic Eyes: conjunctivae/corneas clear. PERRL, EOM's intact. Fundi benign. Ears: normal TM's and external ear canals both ears Nose: Nares normal. Septum midline. Mucosa normal. No drainage or sinus tenderness. Throat: lips, mucosa, and tongue normal; teeth and gums normal Neck: no adenopathy, no carotid bruit, no JVD, supple, symmetrical, trachea midline and thyroid not enlarged, symmetric, no tenderness/mass/nodules Lungs: clear to auscultation bilaterally Heart: regular rate and rhythm, S1, S2 normal, no murmur, click, rub or gallop Abdomen: soft, non-tender; bowel sounds normal; no masses,  no organomegaly Extremities: extremities normal, atraumatic, no cyanosis or edema Skin: Skin color, texture, turgor normal. No rashes or lesions  R lateral plantar surface of foot with large callus with central area of calcification, TTP Lymph nodes: No cervical  lymphadenopathy. Neurologic: Alert and oriented X 3, normal strength and tone. Normal symmetric reflexes. Normal coordination and gait    Assessment:    Healthy male exam.     Plan:      Anticipatory guidance given including wearing seatbelts, smoke detectors in the home, increasing physical activity, increasing p.o. intake of water and vegetables. -will obtain labs-cmp, cbc, lipids, hgb a1c.  Pt to return when fasting. See After Visit Summary for Counseling Recommendations    Callus -referral to podiatry  F/u prn  Abbe Amsterdam, MD

## 2018-05-06 ENCOUNTER — Encounter: Payer: Self-pay | Admitting: Podiatry

## 2018-05-06 ENCOUNTER — Ambulatory Visit (INDEPENDENT_AMBULATORY_CARE_PROVIDER_SITE_OTHER): Payer: 59 | Admitting: Podiatry

## 2018-05-06 DIAGNOSIS — Q828 Other specified congenital malformations of skin: Secondary | ICD-10-CM

## 2018-05-06 DIAGNOSIS — M79671 Pain in right foot: Secondary | ICD-10-CM

## 2018-05-12 NOTE — Progress Notes (Signed)
Subjective:   Patient ID: Brent Calhoun, male   DOB: 47 y.o.   MRN: [id number]   HPI 47 year old male presents the office today for concerns of a painful skin lesion, callus on the right foot, pointing us he states that it is painful with pressure and is try to pull his skin off without any significant improvement.  He denies any redness or drainage or any swelling.  He has no other concerns today.   Review of Systems  All other systems reviewed and are negative.  Past Medical History:  Diagnosis Date  . Vitamin D insufficiency     History reviewed. No pertinent surgical history.  No current outpatient medications on file.  No Known Allergies       Objective:  Physical Exam  General: AAO x3, NAD  Dermatological: Hyperkeratotic lesion right foot splint was applied.  Upon debridement there is no underlying ulceration, drainage or any signs of infection there is no evidence of foreign body.  No other open lesions or pre-ulcerative lesion identified today.  Vascular: Dorsalis Pedis artery and Posterior Tibial artery pedal pulses are 2/4 bilateral with immedate capillary fill time.  There is no pain with calf compression, swelling, warmth, erythema.   Neruologic: Grossly intact via light touch bilateral.  Protective threshold with Semmes Wienstein monofilament intact to all pedal sites bilateral.   Musculoskeletal: No gross boney pedal deformities bilateral. No pain, crepitus, or limitation noted with foot and ankle range of motion bilateral. Muscular strength 5/5 in all groups tested bilateral.  Gait: Unassisted, Nonantalgic.       Assessment:   Right foot hyperkeratotic lesion    Plan:  -Treatment options discussed including all alternatives, risks, and complications -Etiology of symptoms were discussed -Hyperkeratotic lesion sharply debrided x1 without any complications or bleeding.  Discussed moisturizer to the area as well as offloading pads were dispensed today.   Discussed shoe modifications and potential custom orthotic to help offload the area if symptoms continue.  Vivi BarrackMatthew R Wagoner DPM

## 2019-04-28 ENCOUNTER — Encounter: Payer: Self-pay | Admitting: Family Medicine

## 2019-04-28 ENCOUNTER — Ambulatory Visit (INDEPENDENT_AMBULATORY_CARE_PROVIDER_SITE_OTHER): Payer: 59 | Admitting: Family Medicine

## 2019-04-28 ENCOUNTER — Other Ambulatory Visit: Payer: Self-pay

## 2019-04-28 VITALS — BP 110/62 | HR 94 | Temp 98.4°F | Wt 212.0 lb

## 2019-04-28 DIAGNOSIS — M109 Gout, unspecified: Secondary | ICD-10-CM | POA: Diagnosis not present

## 2019-04-28 MED ORDER — METHYLPREDNISOLONE 4 MG PO TBPK
ORAL_TABLET | ORAL | 0 refills | Status: AC
Start: 2019-04-28 — End: ?

## 2019-04-28 MED ORDER — METHYLPREDNISOLONE ACETATE 80 MG/ML IJ SUSP
120.0000 mg | Freq: Once | INTRAMUSCULAR | Status: AC
  Administered 2019-04-28: 120 mg via INTRAMUSCULAR

## 2019-04-28 NOTE — Addendum Note (Signed)
Addended by: Elie Confer on: 04/28/2019 02:06 PM   Modules accepted: Orders

## 2019-04-28 NOTE — Progress Notes (Signed)
   Subjective:    Patient ID: Brent Calhoun, male    DOB: 1971/07/06, 48 y.o.   MRN: [id number]  HPI Here for the sudden onset of swelling and pain in the left wrist 2 days ago. No recent trauma. He thinks it may be from gout. He has only had one other episode of gout, and he was seen here for this in 2015. That episode involved the right wrist. He thinks this came from a cookout he attended last weekend, when he ate steak and drank a fair amount of beer (which is unusual for him).    Review of Systems  Constitutional: Negative.   Respiratory: Negative.   Cardiovascular: Negative.   Musculoskeletal: Positive for arthralgias and joint swelling.       Objective:   Physical Exam Constitutional:      Appearance: Normal appearance.  Cardiovascular:     Rate and Rhythm: Normal rate and regular rhythm.     Pulses: Normal pulses.     Heart sounds: Normal heart sounds.  Pulmonary:     Effort: Pulmonary effort is normal.     Breath sounds: Normal breath sounds.  Musculoskeletal:     Comments: The left wrist is swollen, warm, and very tender    Neurological:     Mental Status: He is alert.           Assessment & Plan:  Gout, treat with a DepoMedrol shot to be followed by a Medrol dose pack. Recheck prn. Alysia Penna, MD

## 2024-01-10 ENCOUNTER — Emergency Department (HOSPITAL_COMMUNITY)
Admission: EM | Admit: 2024-01-10 | Discharge: 2024-01-10 | Disposition: A | Discharge status: Home / Self Care | Payer: Medicaid Other | Attending: Emergency Medicine | Admitting: Emergency Medicine

## 2024-01-10 DIAGNOSIS — M5441 Lumbago with sciatica, right side: Secondary | ICD-10-CM | POA: Diagnosis not present

## 2024-01-10 DIAGNOSIS — M545 Low back pain, unspecified: Secondary | ICD-10-CM | POA: Diagnosis present

## 2024-01-10 DIAGNOSIS — M5431 Sciatica, right side: Secondary | ICD-10-CM

## 2024-01-10 MED ORDER — IBUPROFEN 400 MG PO TABS
600.0000 mg | ORAL_TABLET | Freq: Once | ORAL | Status: AC
  Administered 2024-01-10: 600 mg via ORAL
  Filled 2024-01-10: qty 1

## 2024-01-10 MED ORDER — ACETAMINOPHEN 325 MG PO TABS
975.0000 mg | ORAL_TABLET | Freq: Once | ORAL | Status: AC
  Administered 2024-01-10: 975 mg via ORAL
  Filled 2024-01-10: qty 3

## 2024-01-10 MED ORDER — CYCLOBENZAPRINE HCL 10 MG PO TABS: 10.0000 mg | ORAL_TABLET | Freq: Two times a day (BID) | ORAL | 0 refills | Status: AC | PRN

## 2024-01-10 NOTE — ED Provider Notes (Signed)
[facility] AT [facility] Provider Note   CSN: [id number] Arrival date & time: 01/10/24  1216     History  Chief Complaint  Patient presents with   Back Pain    Brent Calhoun is a 53 y.o. male.  With no significant medical history who presents to ED for back pain.  Patient has been dealing with subacute lower back pain for the last few months.  Earlier this week he was taking out the trash and felt a pop in his back.  Since that time has had right lower back pain and pain going down the right lower extremity.  No fevers, chills, urinary incontinence, bowel incontinence, urinary retention or difficulty with ambulation.  No prior history of back surgeries.   Back Pain      Home Medications Prior to Admission medications   Medication Sig Start Date End Date Taking? Authorizing Provider  cyclobenzaprine (FLEXERIL) 10 MG tablet Take 1 tablet (10 mg total) by mouth 2 (two) times daily as needed for up to 5 days for muscle spasms. 01/10/24 01/15/24 Yes Royanne Foots, DO  methylPREDNISolone (MEDROL DOSEPAK) 4 MG TBPK tablet As directed 04/28/19   Nelwyn Salisbury, MD      Allergies    Patient has no known allergies.    Review of Systems   Review of Systems  Musculoskeletal:  Positive for back pain.    Physical Exam Updated Vital Signs BP 128/76   Pulse 74   Temp 97.8 F (36.6 C)   Resp 17   SpO2 96%  Physical Exam Vitals and nursing note reviewed.  HENT:     Head: Normocephalic and atraumatic.  Eyes:     Pupils: Pupils are equal, round, and reactive to light.  Cardiovascular:     Rate and Rhythm: Normal rate and regular rhythm.  Pulmonary:     Effort: Pulmonary effort is normal.     Breath sounds: Normal breath sounds.  Abdominal:     Palpations: Abdomen is soft.     Tenderness: There is no abdominal tenderness.  Musculoskeletal:     Comments: Right paraspinal lumbar tenderness No midline tenderness above or deformity Tenderness  along the right lateral thigh No sensory deficit 5 out of 5 motor strength bilateral lower extremities Distal pulses intact bilateral lower extremities  Skin:    General: Skin is warm and dry.  Neurological:     Mental Status: He is alert.     Sensory: No sensory deficit.     Motor: No weakness.     Comments: Normal gait  Psychiatric:        Mood and Affect: Mood normal.     ED Results / Procedures / Treatments   Labs (all labs ordered are listed, but only abnormal results are displayed) Labs Reviewed - No data to display  EKG None  Radiology No results found.  Procedures Procedures    Medications Ordered in ED Medications  acetaminophen (TYLENOL) tablet 975 mg (has no administration in time range)  ibuprofen (ADVIL) tablet 600 mg (has no administration in time range)    ED Course/ Medical Decision Making/ A&P                                 Medical Decision Making 53 year old male with history as above presenting for acute on subacute lower back pain.  Was taking out the trash a few days ago and "  felt a pop" in the back.  Now with right lower back pain and radiation to right lower extremity.  No midline tenderness.  Low suspicion for spinal cord involvement.  More likely sciatic nerve involvement.  Instructed on symptomatic management of sciatic back pain.  Given that he is tender in the upper right glue will prescribe short course of Flexeril to help with muscle spasming.  Return precautions were discussed in detail.  He will follow-up with his PCP.           Final Clinical Impression(s) / ED Diagnoses Final diagnoses:  Sciatica of right side    Rx / DC Orders ED Discharge Orders          Ordered    cyclobenzaprine (FLEXERIL) 10 MG tablet  2 times daily PRN        01/10/24 1744              Royanne Foots, DO 01/10/24 1745

## 2024-01-10 NOTE — ED Triage Notes (Signed)
Patient reports lower back pain with radiation to RLE for several months. Six weeks ago heard a pop in his back while at work and pain continues. No OTC meds taken.

## 2024-01-10 NOTE — Discharge Instructions (Signed)
You were seen in the emergency room for lower back pain Based on your story and examination this is most likely sciatica Sciatica is due to irritation of the sciatic nerve which runs from the lower back down around to the leg For this reason you should take Tylenol Motrin as directed for discomfort We have prescribed a short course of Flexeril which is a muscle relaxer to help with the muscle spasming in the right lower back You do not drink alcohol or drive while taking Flexeril Return to the emergency department for severe pain, if you are unable to walk or have any other concerns I was please follow-up with your primary care doctor in 1 week for reevaluation
